# Patient Record
Sex: Male | Born: 1965 | Race: White | Hispanic: No | Marital: Single | Smoking: Never smoker
Health system: Southern US, Community
[De-identification: ages and names within clinical notes are randomized; demographics above are authoritative.]

---

## 2020-04-12 ENCOUNTER — Emergency Department (HOSPITAL_COMMUNITY): Payer: BC Managed Care – PPO

## 2020-04-12 ENCOUNTER — Other Ambulatory Visit: Payer: Self-pay

## 2020-04-12 ENCOUNTER — Emergency Department (HOSPITAL_COMMUNITY)
Admission: EM | Admit: 2020-04-12 | Discharge: 2020-04-12 | Disposition: A | Discharge status: Home / Self Care | Payer: BC Managed Care – PPO | Attending: Emergency Medicine | Admitting: Emergency Medicine

## 2020-04-12 ENCOUNTER — Encounter (HOSPITAL_COMMUNITY): Payer: Self-pay | Admitting: Obstetrics and Gynecology

## 2020-04-12 DIAGNOSIS — R202 Paresthesia of skin: Secondary | ICD-10-CM | POA: Diagnosis present

## 2020-04-12 DIAGNOSIS — R200 Anesthesia of skin: Secondary | ICD-10-CM

## 2020-04-12 DIAGNOSIS — R410 Disorientation, unspecified: Secondary | ICD-10-CM | POA: Diagnosis not present

## 2020-04-12 LAB — URINALYSIS, ROUTINE W REFLEX MICROSCOPIC
Bilirubin Urine: NEGATIVE
Glucose, UA: NEGATIVE mg/dL
Hgb urine dipstick: NEGATIVE
Ketones, ur: NEGATIVE mg/dL
Leukocytes,Ua: NEGATIVE
Nitrite: NEGATIVE
Protein, ur: NEGATIVE mg/dL
Specific Gravity, Urine: 1.017 (ref 1.005–1.030)
pH: 6 (ref 5.0–8.0)

## 2020-04-12 LAB — DIFFERENTIAL
Abs Immature Granulocytes: 0.02 10*3/uL (ref 0.00–0.07)
Basophils Absolute: 0.1 10*3/uL (ref 0.0–0.1)
Basophils Relative: 1 %
Eosinophils Absolute: 0.2 10*3/uL (ref 0.0–0.5)
Eosinophils Relative: 2 %
Immature Granulocytes: 0 %
Lymphocytes Relative: 26 %
Lymphs Abs: 1.9 10*3/uL (ref 0.7–4.0)
Monocytes Absolute: 0.7 10*3/uL (ref 0.1–1.0)
Monocytes Relative: 10 %
Neutro Abs: 4.5 10*3/uL (ref 1.7–7.7)
Neutrophils Relative %: 61 %

## 2020-04-12 LAB — CBG MONITORING, ED: Glucose-Capillary: 117 mg/dL — ABNORMAL HIGH (ref 70–99)

## 2020-04-12 LAB — COMPREHENSIVE METABOLIC PANEL
ALT: 30 U/L (ref 0–44)
AST: 15 U/L (ref 15–41)
Albumin: 4.2 g/dL (ref 3.5–5.0)
Alkaline Phosphatase: 72 U/L (ref 38–126)
Anion gap: 6 (ref 5–15)
BUN: 16 mg/dL (ref 6–20)
CO2: 28 mmol/L (ref 22–32)
Calcium: 9.2 mg/dL (ref 8.9–10.3)
Chloride: 104 mmol/L (ref 98–111)
Creatinine, Ser: 1.11 mg/dL (ref 0.61–1.24)
GFR calc Af Amer: >60 mL/min (ref >60)
GFR calc non Af Amer: >60 mL/min (ref >60)
Glucose, Bld: 112 mg/dL — ABNORMAL HIGH (ref 70–99)
Potassium: 3.8 mmol/L (ref 3.5–5.1)
Sodium: 138 mmol/L (ref 135–145)
Total Bilirubin: 0.7 mg/dL (ref 0.3–1.2)
Total Protein: 6.9 g/dL (ref 6.5–8.1)

## 2020-04-12 LAB — RAPID URINE DRUG SCREEN, HOSP PERFORMED
Amphetamines: NOT DETECTED
Barbiturates: NOT DETECTED
Benzodiazepines: NOT DETECTED
Cocaine: NOT DETECTED
Opiates: NOT DETECTED
Tetrahydrocannabinol: NOT DETECTED

## 2020-04-12 LAB — CBC
HCT: 45.4 % (ref 39.0–52.0)
Hemoglobin: 14.9 g/dL (ref 13.0–17.0)
MCH: 29.7 pg (ref 26.0–34.0)
MCHC: 32.8 g/dL (ref 30.0–36.0)
MCV: 90.6 fL (ref 80.0–100.0)
Platelets: 194 10*3/uL (ref 150–400)
RBC: 5.01 MIL/uL (ref 4.22–5.81)
RDW: 14.2 % (ref 11.5–15.5)
WBC: 7.3 10*3/uL (ref 4.0–10.5)
nRBC: 0 % (ref 0.0–0.2)

## 2020-04-12 LAB — PROTIME-INR
INR: 1 (ref 0.8–1.2)
Prothrombin Time: 13.2 seconds (ref 11.4–15.2)

## 2020-04-12 LAB — TROPONIN I (HIGH SENSITIVITY): Troponin I (High Sensitivity): <2 ng/L (ref <18)

## 2020-04-12 LAB — APTT: aPTT: 31 seconds (ref 24–36)

## 2020-04-12 LAB — ETHANOL: Alcohol, Ethyl (B): <10 mg/dL (ref <10)

## 2020-04-12 NOTE — ED Triage Notes (Signed)
Patient reports to the ER for arm numbness and tingling as well as confusion, sweating and feeling overall strange. Patient reports symptoms started at 1500.

## 2020-04-12 NOTE — ED Notes (Signed)
PT transported to MRI

## 2020-04-12 NOTE — ED Notes (Signed)
EDP at bedside  

## 2020-04-12 NOTE — ED Notes (Signed)
Pt returned from MRI °

## 2020-04-12 NOTE — ED Provider Notes (Signed)
[facility]-EMERGENCY DEPT Provider Note   CSN: [id number] Arrival date & time: 04/12/20  1602     History Chief Complaint  Patient presents with  . Numbness    Kent Peck is a 54 y.o. male.  Pt presents to the ED today with numbness to his left arm.  Pt is driving from [facility] to [place].  While driving through [place] around 1500, he developed numbness to his left arm.  He also developed some confusion and broke out in a sweat.  He also had some numbness to his right and left legs.  He denies any weakness.  No difficulty speaking.  Pt pulled over and then drove here.  Pt able to ambulate.  No hx of TIA or Stroke.  Numbness is wearing off, but is still there in the left arm.  Pt denies sob or cp.  He has not been vaccinated against Covid.  He denies f/c.        History reviewed. No pertinent past medical history.  There are no problems to display for this patient.   History reviewed. No pertinent surgical history.     No family history on file.  Social History   Tobacco Use  . Smoking status: Never Smoker  Vaping Use  . Vaping Use: Never used  Substance Use Topics  . Alcohol use: Not Currently  . Drug use: Not Currently    Home Medications Prior to Admission medications   Not on File    Allergies    Patient has no allergy information on record.  Review of Systems   Review of Systems  Neurological: Positive for numbness.  All other systems reviewed and are negative.   Physical Exam Updated Vital Signs BP 133/90 (BP Location: Left Arm)   Pulse 87   Temp 98.2 F (36.8 C)   Resp 16   SpO2 99%   Physical Exam Vitals and nursing note reviewed.  Constitutional:      Appearance: Normal appearance.  HENT:     Head: Normocephalic and atraumatic.     Right Ear: External ear normal.     Left Ear: External ear normal.     Nose: Nose normal.     Mouth/Throat:     Mouth: Mucous membranes are moist.     Pharynx: Oropharynx is clear.   Eyes:     Extraocular Movements: Extraocular movements intact.     Conjunctiva/sclera: Conjunctivae normal.     Pupils: Pupils are equal, round, and reactive to light.  Cardiovascular:     Rate and Rhythm: Normal rate and regular rhythm.     Pulses: Normal pulses.     Heart sounds: Normal heart sounds.  Pulmonary:     Effort: Pulmonary effort is normal.     Breath sounds: Normal breath sounds.  Abdominal:     General: Abdomen is flat. Bowel sounds are normal.     Palpations: Abdomen is soft.  Musculoskeletal:        General: Normal range of motion.     Cervical back: Normal range of motion and neck supple.  Skin:    General: Skin is warm.     Capillary Refill: Capillary refill takes less than 2 seconds.  Neurological:     General: No focal deficit present.     Mental Status: He is alert and oriented to person, place, and time.  Psychiatric:        Mood and Affect: Mood normal.  Behavior: Behavior normal.        Thought Content: Thought content normal.        Judgment: Judgment normal.     ED Results / Procedures / Treatments   Labs (all labs ordered are listed, but only abnormal results are displayed) Labs Reviewed  COMPREHENSIVE METABOLIC PANEL - Abnormal; Notable for the following components:      Result Value   Glucose, Bld 112 (*)    All other components within normal limits  CBG MONITORING, ED - Abnormal; Notable for the following components:   Glucose-Capillary 117 (*)    All other components within normal limits  PROTIME-INR  APTT  CBC  DIFFERENTIAL  ETHANOL  URINALYSIS, ROUTINE W REFLEX MICROSCOPIC  RAPID URINE DRUG SCREEN, HOSP PERFORMED  I-STAT CHEM 8, ED  CBG MONITORING, ED  TROPONIN I (HIGH SENSITIVITY)    EKG EKG Interpretation  Date/Time:  Wednesday April 12 2020 16:21:06 EDT Ventricular Rate:  92 PR Interval:  116 QRS Duration: 72 QT Interval:  342 QTC Calculation: 422 R Axis:   24 Text Interpretation: Normal sinus rhythm Normal  ECG Confirmed by Jacalyn Lefevre [phone]) on 04/12/2020 4:37:57 PM   Radiology CT HEAD WO CONTRAST  Result Date: 04/12/2020 CLINICAL DATA:  Neuro deficit, acute, stroke suspected. Additional history provided: Numbness and tingling as well as confusion, sweating and feeling "all over strange." EXAM: CT HEAD WITHOUT CONTRAST TECHNIQUE: Contiguous axial images were obtained from the base of the skull through the vertex without intravenous contrast. COMPARISON:  No pertinent prior exams are available for comparison. FINDINGS: Brain: Cerebral volume is normal for age. There is no acute intracranial hemorrhage. No demarcated cortical infarct. No extra-axial fluid collection. No evidence of intracranial mass. No midline shift. Vascular: No hyperdense vessel. Skull: Normal. Negative for fracture or focal lesion. Sinuses/Orbits: Visualized orbits show no acute finding. No significant paranasal sinus disease or mastoid effusion at the imaged levels. IMPRESSION: Unremarkable non-contrast CT appearance of the brain. No evidence of acute intracranial abnormality. Electronically Signed   By: Jackey Loge DO   On: 04/12/2020 16:56   MR BRAIN WO CONTRAST  Result Date: 04/12/2020 CLINICAL DATA:  Neuro deficit, acute, stroke suspected. Additional history provided: Left arm numbness, face numbness, lightheadedness. EXAM: MRI HEAD WITHOUT CONTRAST TECHNIQUE: Multiplanar, multiecho pulse sequences of the brain and surrounding structures were obtained without intravenous contrast. COMPARISON:  Noncontrast head CT 04/12/2020. FINDINGS: Brain: Cerebral volume is normal for age. There is no significant white matter disease. There is no acute infarct. No evidence of intracranial mass. No chronic intracranial blood products. No extra-axial fluid collection. No midline shift. Vascular: Expected proximal arterial flow voids. Skull and upper cervical spine: No focal marrow lesion. Sinuses/Orbits: Visualized orbits show no acute finding. No  significant paranasal sinus disease or mastoid effusion at the imaged levels. IMPRESSION: Unremarkable MRI appearance of the brain for age. No evidence of acute intracranial abnormality, including acute infarction. Electronically Signed   By: Jackey Loge DO   On: 04/12/2020 18:37    Procedures Procedures (including critical care time)  Medications Ordered in ED Medications - No data to display  ED Course  I have reviewed the triage vital signs and the nursing notes.  Pertinent labs & imaging results that were available during my care of the patient were reviewed by me and considered in my medical decision making (see chart for details).    MDM Rules/Calculators/A&P  Code stroke not called as there was numbness on both sides of his body.  NIHSS 1 and improving, so pt would not qualify for tpa even if he had a stroke.  Pt's CT nl.  MRI ordered and was normal.  Pt's labs are normal.  Pt is stable for d/c.  Return if worse.  F/u with pcp when he gets home to [facility].   Final Clinical Impression(s) / ED Diagnoses Final diagnoses:  Numbness    Rx / DC Orders ED Discharge Orders    None       Jacalyn Lefevre, MD 04/12/20 [facility]

## 2021-04-16 IMAGING — CT CT HEAD W/O CM
3 series · 15 of 47 positions shown, 18 images · non-contrast
Comparison: No pertinent prior exams are available for comparison.

CLINICAL DATA: Neuro deficit, acute, stroke suspected. Additional
history provided: Numbness and tingling as well as confusion,
sweating and feeling "all over strange."

EXAM:
CT HEAD WITHOUT CONTRAST
TECHNIQUE: Contiguous axial images were obtained from the base of the skull
through the vertex without intravenous contrast.

[Series 2: head wo · axial · 0.46mm/px · z∈[-70,+75]mm · 9 of 35 slices shown, 12 images]
[im 3/35  brain]
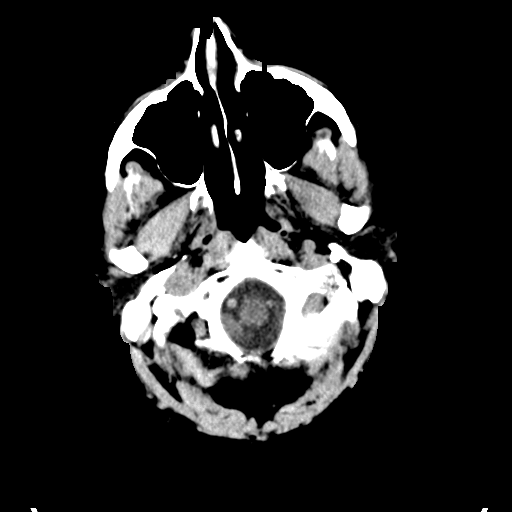
[im 3/35  bone]
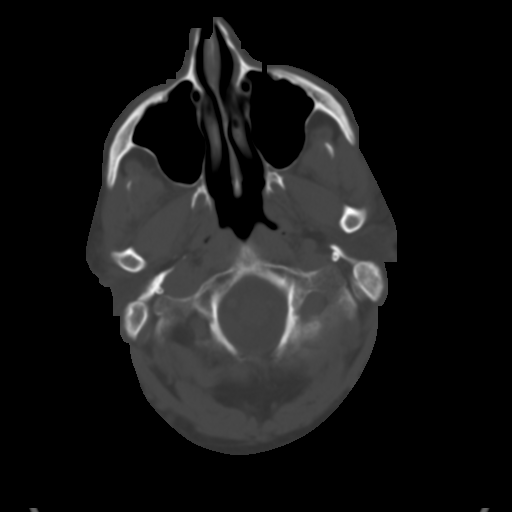
[im 6/35  brain]
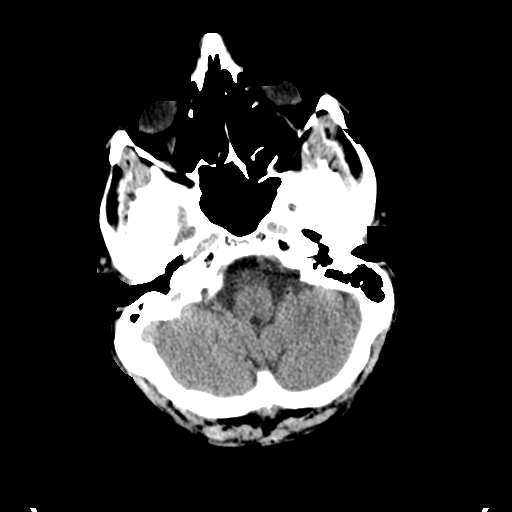
[im 10/35  brain]
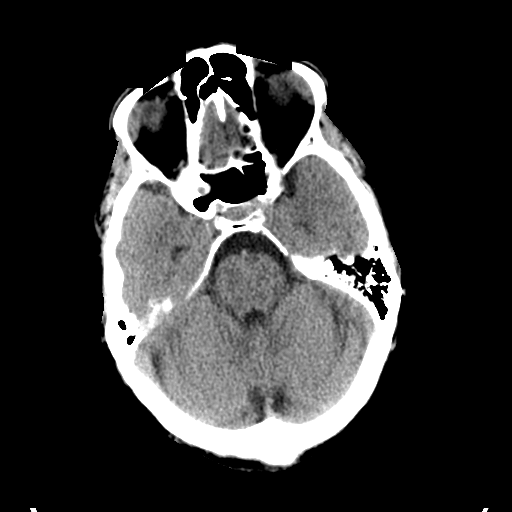
[im 13/35  brain]
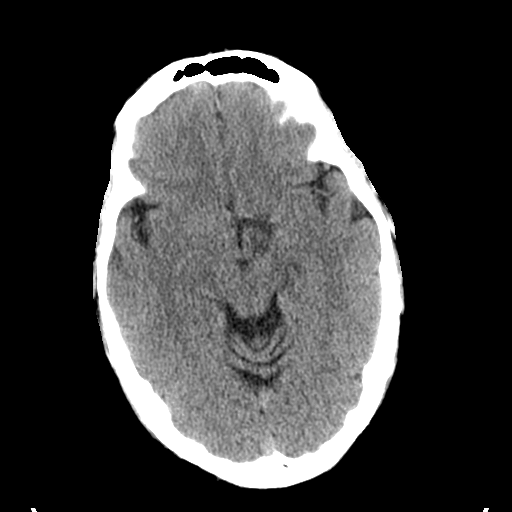
[im 18/35  brain]
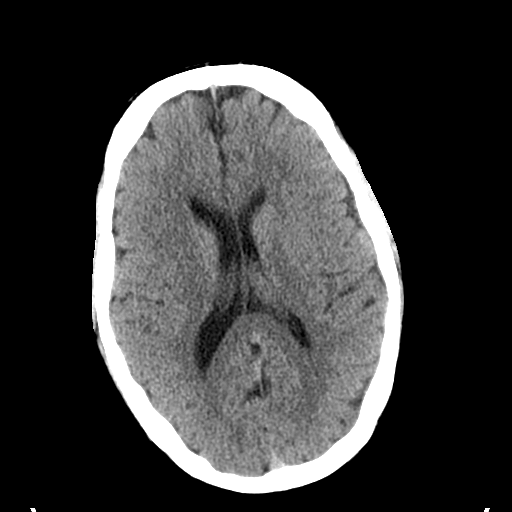
[im 18/35  bone]
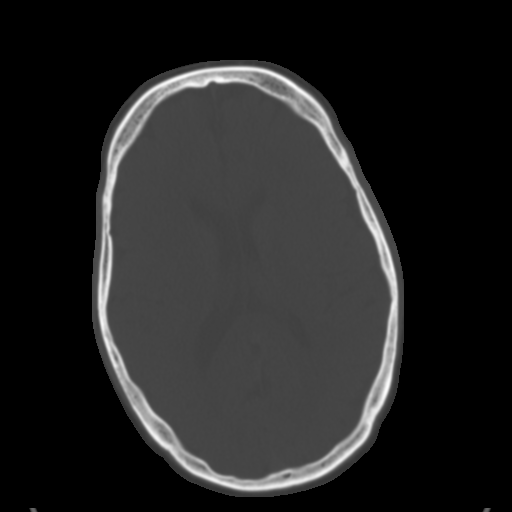
[im 22/35  brain]
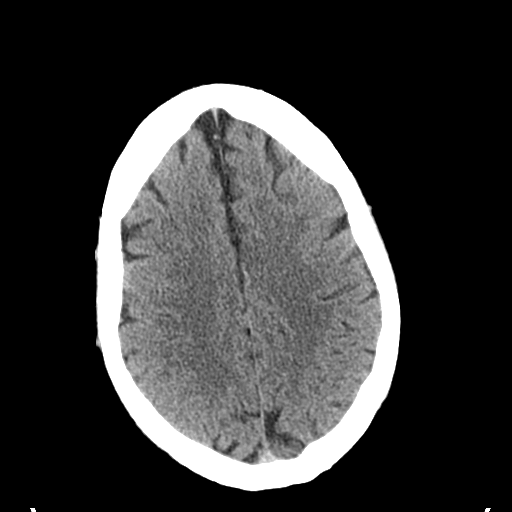
[im 25/35  brain]
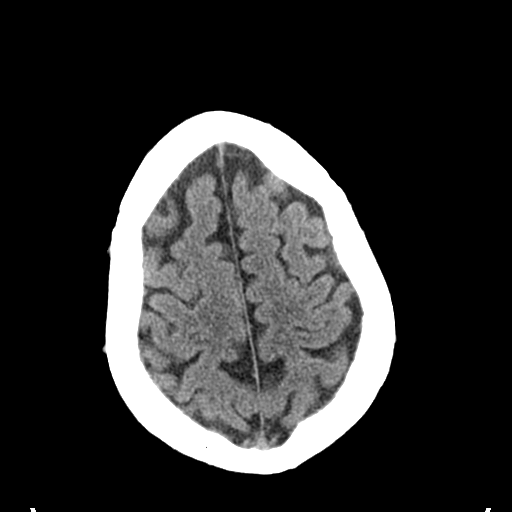
[im 29/35  brain]
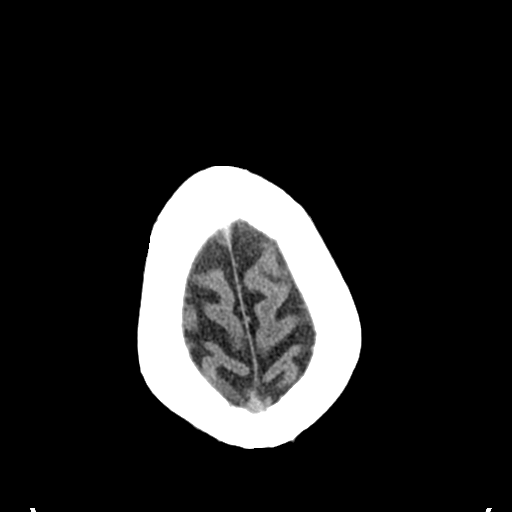
[im 32/35  brain]
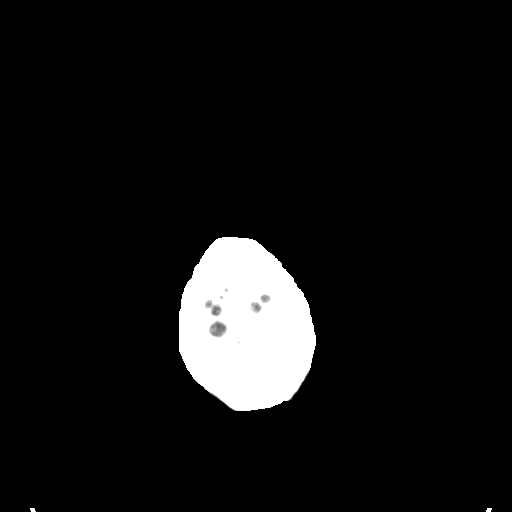
[im 32/35  bone]
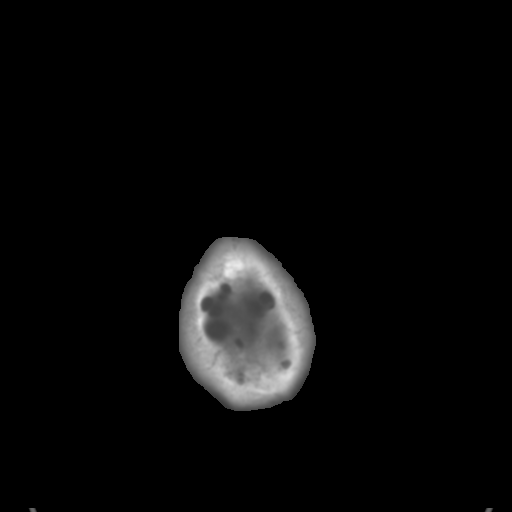

[Series 5: coronal soft tissue · coronal · 0.31mm/px · 3 of 73 slices shown]
[im 25/73  brain]
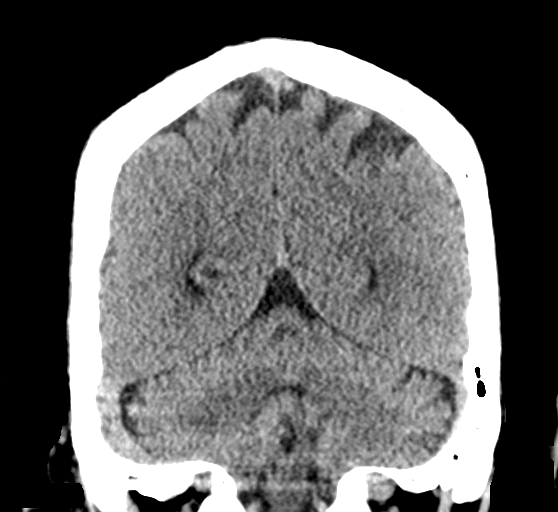
[im 33/73  brain]
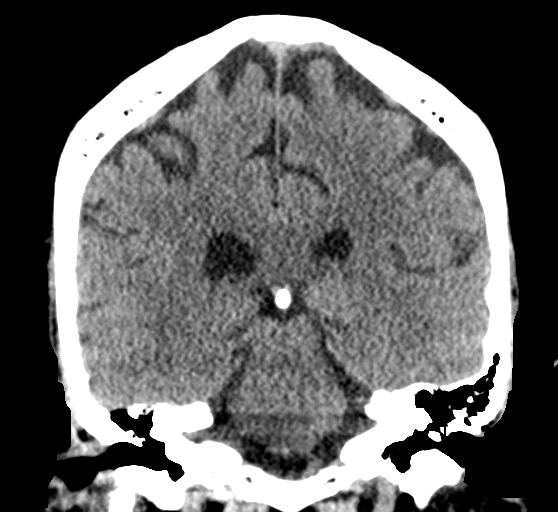
[im 40/73  brain]
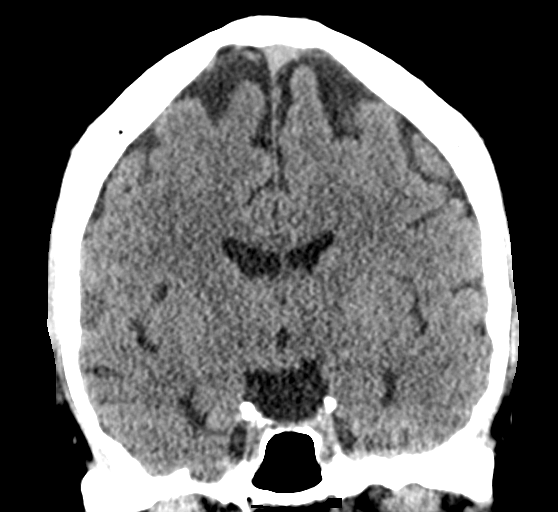

[Series 6: sagittal soft tissue · sagittal · 0.31mm/px · 3 of 58 slices shown]
[im 20/58  brain]
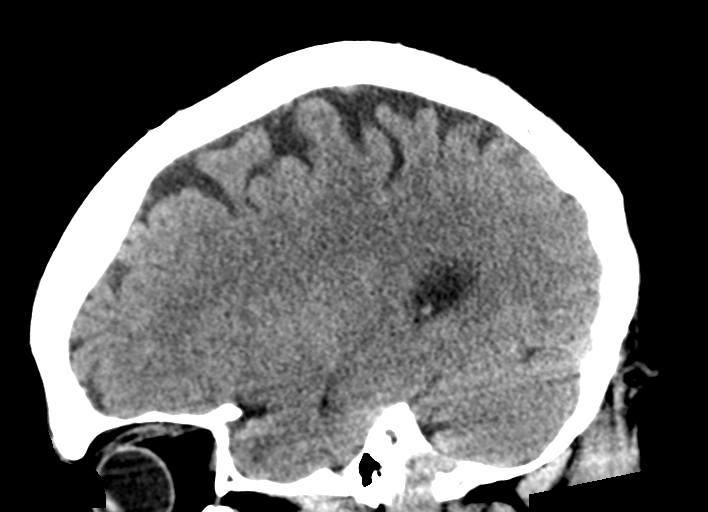
[im 29/58  brain]
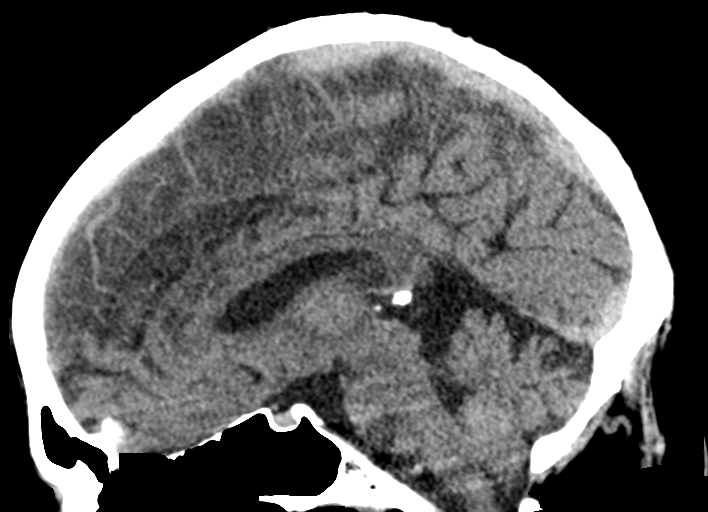
[im 39/58  brain]
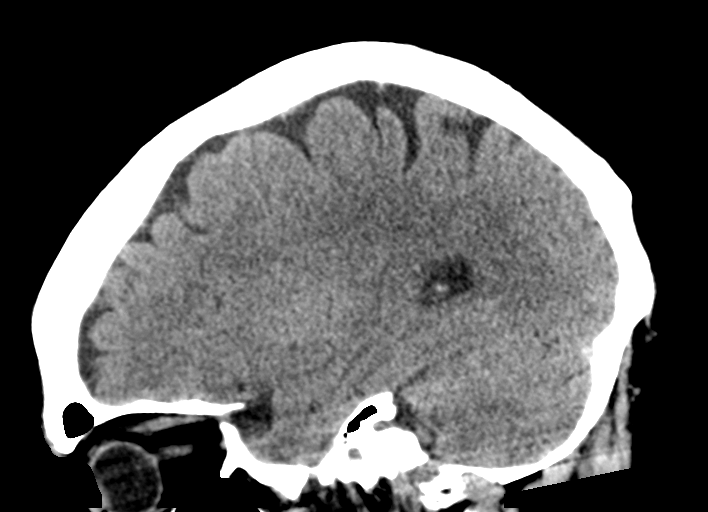

[15 of 47 positions shown; findings below may reference images not displayed]

FINDINGS: Brain:

Cerebral volume is normal for age.

There is no acute intracranial hemorrhage.

No demarcated cortical infarct.

No extra-axial fluid collection.

No evidence of intracranial mass.

No midline shift.

Vascular: No hyperdense vessel.

Skull: Normal. Negative for fracture or focal lesion.

Sinuses/Orbits: Visualized orbits show no acute finding. No
significant paranasal sinus disease or mastoid effusion at the
imaged levels.
IMPRESSION: Unremarkable non-contrast CT appearance of the brain. No evidence of
acute intracranial abnormality.

## 2021-04-16 IMAGING — MR MR HEAD W/O CM
13 series · 48 of 48 positions shown · non-contrast
Comparison: Noncontrast head CT 04/12/2020.

CLINICAL DATA: Neuro deficit, acute, stroke suspected. Additional
history provided: Left arm numbness, face numbness, lightheadedness.

EXAM:
MRI HEAD WITHOUT CONTRAST
TECHNIQUE: Multiplanar, multiecho pulse sequences of the brain and surrounding
structures were obtained without intravenous contrast.

[Series 5: DWI · axial · 3.0mm · 1.36mm/px · z∈[-77,+93]mm · 8 of 116 slices shown (1 of 4)]
[im 1/116]
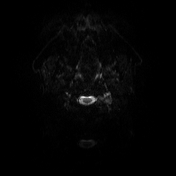
[im 17/116]
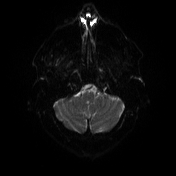
[im 33/116]
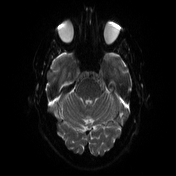
[im 50/116]
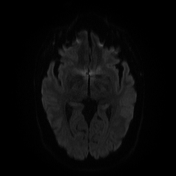
[im 66/116]
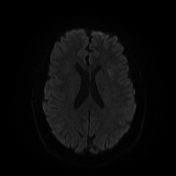
[im 83/116]
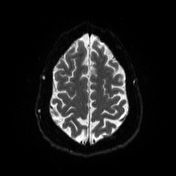
[im 99/116]
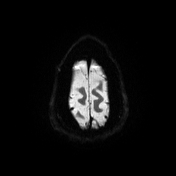
[im 116/116]
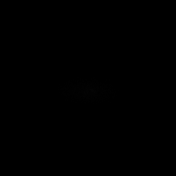

[Series 6: DWI · axial · 3.0mm · 1.36mm/px · z∈[-77,+93]mm · 4 of 58 slices shown (2 of 4)]
[im 1/58]
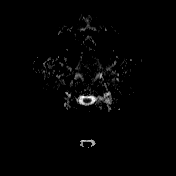
[im 20/58]
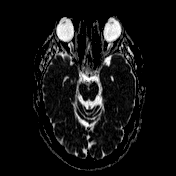
[im 39/58]
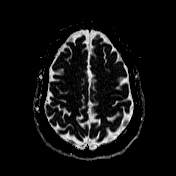
[im 58/58]
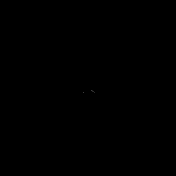

[Series 7: T1 · sagittal · 5.0mm · 0.81mm/px · 1 of 24 slices shown (1 of 2)]
[im 1/24]
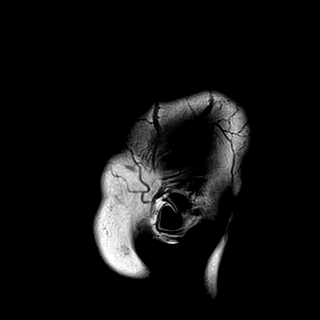

[Series 8: T2 · axial · 5.0mm · 0.62mm/px · z∈[-86,+101]mm · 2 of 30 slices shown (1 of 2)]
[im 1/30]
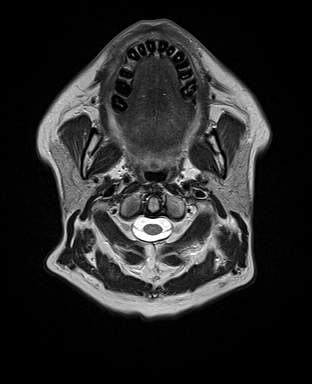
[im 30/30]
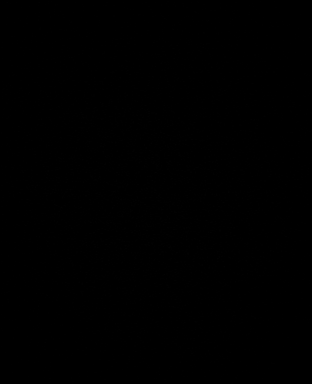

[Series 9: mip_images(sw) · axial · 24.0mm · 0.75mm/px · z∈[-75,+91]mm · 3 of 57 slices shown]
[im 1/57]
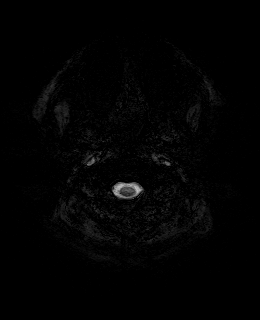
[im 29/57]
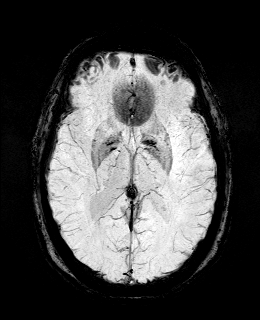
[im 57/57]
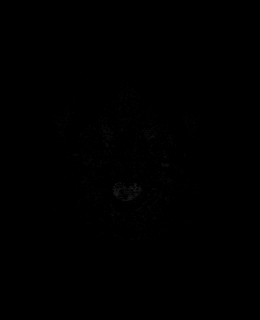

[Series 10: swi_images · axial · 3.0mm · 0.75mm/px · z∈[-86,+102]mm · 4 of 64 slices shown]
[im 1/64]
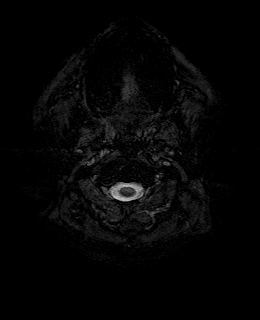
[im 22/64]
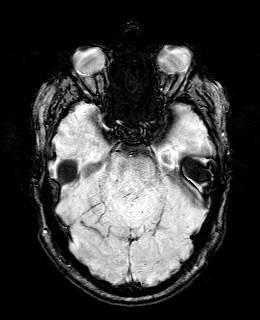
[im 43/64]
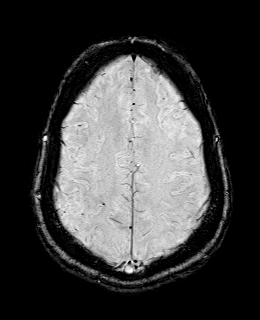
[im 64/64]
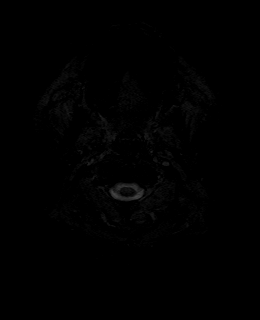

[Series 11: FLAIR · axial · 3.0mm · 0.75mm/px · z∈[-80,+96]mm · 3 of 60 slices shown]
[im 1/60]
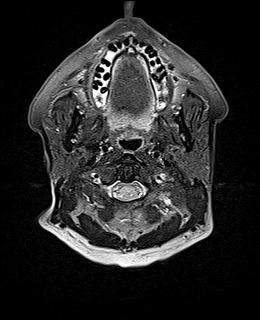
[im 30/60]
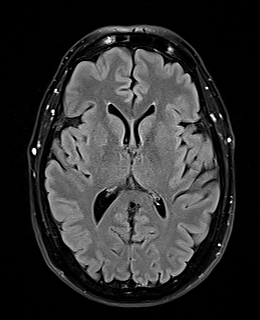
[im 60/60]
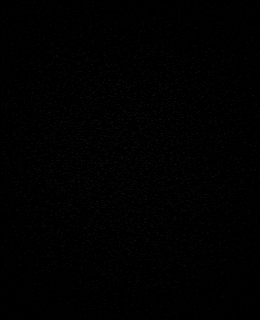

[Series 12: T1 · axial · 1.0mm · 0.94mm/px · z∈[-75,+98]mm · 10 of 176 slices shown (2 of 2)]
[im 1/176]
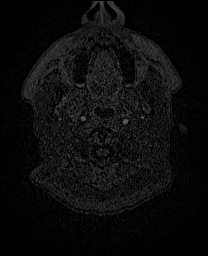
[im 20/176]
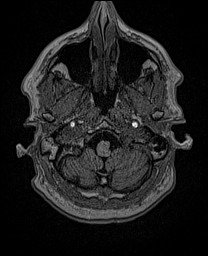
[im 39/176]
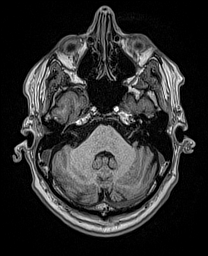
[im 59/176]
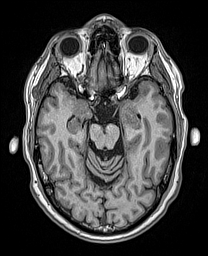
[im 78/176]
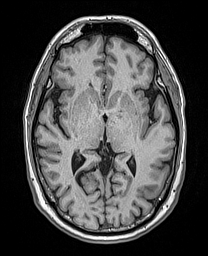
[im 98/176]
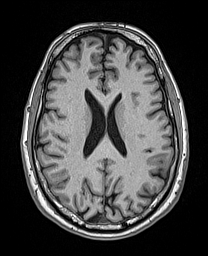
[im 117/176]
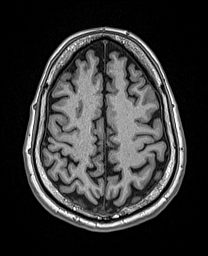
[im 137/176]
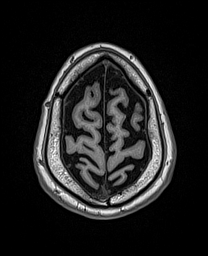
[im 156/176]
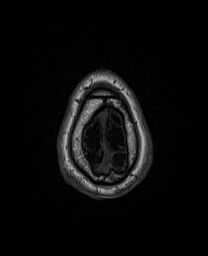
[im 176/176]
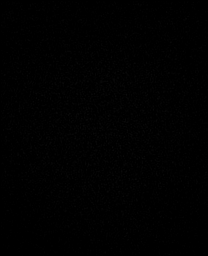

[Series 13: DWI · coronal · 5.0mm · 1.31mm/px · 5 of 84 slices shown (3 of 4)]
[im 1/84]
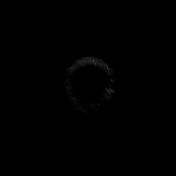
[im 21/84]
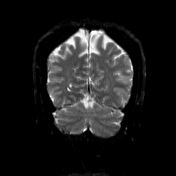
[im 42/84]
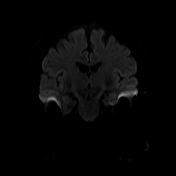
[im 63/84]
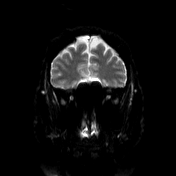
[im 84/84]
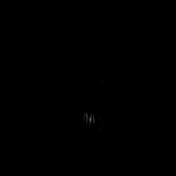

[Series 14: DWI · coronal · 5.0mm · 1.31mm/px · 2 of 42 slices shown (4 of 4)]
[im 1/42]
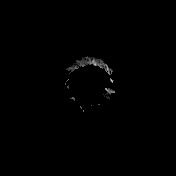
[im 42/42]
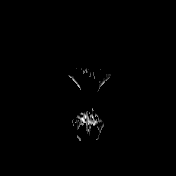

[Series 15: resolve dwi_tracew · axial · 5.0mm · 1.81mm/px · z∈[-76,+92]mm · 3 of 54 slices shown]
[im 1/54]
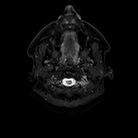
[im 27/54]
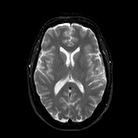
[im 54/54]
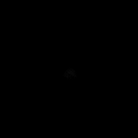

[Series 16: resolve dwi_adc · axial · 5.0mm · 1.81mm/px · 1 of 26 slices shown]
[im 1/26]
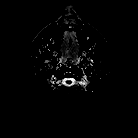

[Series 17: T2 · coronal · 5.0mm · 0.57mm/px · 2 of 42 slices shown (2 of 2)]
[im 1/42]
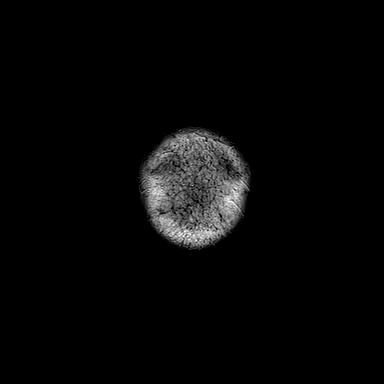
[im 42/42]
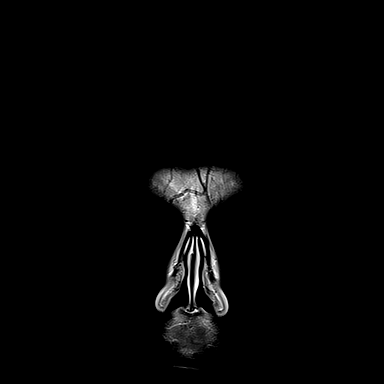

[48 of 48 positions shown; findings below may reference images not displayed]

FINDINGS: Brain:

Cerebral volume is normal for age.

There is no significant white matter disease.

There is no acute infarct.

No evidence of intracranial mass.

No chronic intracranial blood products.

No extra-axial fluid collection.

No midline shift.

Vascular: Expected proximal arterial flow voids.

Skull and upper cervical spine: No focal marrow lesion.

Sinuses/Orbits: Visualized orbits show no acute finding. No
significant paranasal sinus disease or mastoid effusion at the
imaged levels.
IMPRESSION: Unremarkable MRI appearance of the brain for age.

No evidence of acute intracranial abnormality, including acute
infarction.
